# Patient Record
Sex: Female | Born: 1975 | Race: White | Hispanic: No | Smoking: Former smoker
Health system: Southern US, Community
[De-identification: ages and names within clinical notes are randomized; demographics above are authoritative.]

## PROBLEM LIST (undated history)

## (undated) DIAGNOSIS — Z8711 Personal history of peptic ulcer disease: Secondary | ICD-10-CM

## (undated) DIAGNOSIS — Z8489 Family history of other specified conditions: Secondary | ICD-10-CM

## (undated) DIAGNOSIS — K219 Gastro-esophageal reflux disease without esophagitis: Secondary | ICD-10-CM

## (undated) DIAGNOSIS — F329 Major depressive disorder, single episode, unspecified: Secondary | ICD-10-CM

## (undated) DIAGNOSIS — M1990 Unspecified osteoarthritis, unspecified site: Secondary | ICD-10-CM

## (undated) DIAGNOSIS — F419 Anxiety disorder, unspecified: Secondary | ICD-10-CM

## (undated) DIAGNOSIS — F32A Depression, unspecified: Secondary | ICD-10-CM

## (undated) HISTORY — PX: CHOLECYSTECTOMY: SHX55

## (undated) HISTORY — PX: TUBAL LIGATION: SHX77

---

## 2007-09-13 ENCOUNTER — Other Ambulatory Visit: Payer: Self-pay

## 2007-09-13 ENCOUNTER — Emergency Department: Payer: Self-pay | Admitting: Internal Medicine

## 2010-07-11 ENCOUNTER — Ambulatory Visit: Payer: Self-pay | Admitting: Surgery

## 2010-07-18 ENCOUNTER — Ambulatory Visit: Payer: Self-pay | Admitting: Surgery

## 2010-07-20 LAB — PATHOLOGY REPORT

## 2010-09-26 ENCOUNTER — Emergency Department: Payer: Self-pay | Admitting: Emergency Medicine

## 2010-11-18 ENCOUNTER — Ambulatory Visit: Payer: Self-pay | Admitting: Family Medicine

## 2011-09-25 ENCOUNTER — Emergency Department: Payer: Self-pay | Admitting: *Deleted

## 2012-11-05 ENCOUNTER — Emergency Department: Payer: Self-pay | Admitting: Emergency Medicine

## 2012-11-05 LAB — CK TOTAL AND CKMB (NOT AT ARMC): CK-MB: 0.7 ng/mL (ref 0.5–3.6)

## 2012-11-05 LAB — BASIC METABOLIC PANEL
Anion Gap: 8 (ref 7–16)
BUN: 8 mg/dL (ref 7–18)
Calcium, Total: 8.8 mg/dL (ref 8.5–10.1)
Co2: 25 mmol/L (ref 21–32)
Creatinine: 0.77 mg/dL (ref 0.60–1.30)
EGFR (Non-African Amer.): >60
Potassium: 3.6 mmol/L (ref 3.5–5.1)
Sodium: 138 mmol/L (ref 136–145)

## 2012-11-05 LAB — CBC
MCHC: 33.9 g/dL (ref 32.0–36.0)
MCV: 87 fL (ref 80–100)
RBC: 4.49 10*6/uL (ref 3.80–5.20)

## 2013-12-16 ENCOUNTER — Emergency Department: Payer: Self-pay | Admitting: Emergency Medicine

## 2016-05-09 ENCOUNTER — Encounter
Admission: RE | Admit: 2016-05-09 | Discharge: 2016-05-09 | Disposition: A | Discharge status: Home / Self Care | Payer: BLUE CROSS/BLUE SHIELD | Source: Ambulatory Visit | Attending: Obstetrics and Gynecology | Admitting: Obstetrics and Gynecology

## 2016-05-09 DIAGNOSIS — Z01812 Encounter for preprocedural laboratory examination: Secondary | ICD-10-CM | POA: Insufficient documentation

## 2016-05-09 HISTORY — DX: Gastro-esophageal reflux disease without esophagitis: K21.9

## 2016-05-09 HISTORY — DX: Anxiety disorder, unspecified: F41.9

## 2016-05-09 HISTORY — DX: Family history of other specified conditions: Z84.89

## 2016-05-09 HISTORY — DX: Unspecified osteoarthritis, unspecified site: M19.90

## 2016-05-09 HISTORY — DX: Depression, unspecified: F32.A

## 2016-05-09 HISTORY — DX: Personal history of peptic ulcer disease: Z87.11

## 2016-05-09 HISTORY — DX: Major depressive disorder, single episode, unspecified: F32.9

## 2016-05-09 LAB — COMPREHENSIVE METABOLIC PANEL
ALT: 13 U/L — ABNORMAL LOW (ref 14–54)
AST: 17 U/L (ref 15–41)
Albumin: 4.4 g/dL (ref 3.5–5.0)
Alkaline Phosphatase: 65 U/L (ref 38–126)
Anion gap: 5 (ref 5–15)
BUN: 12 mg/dL (ref 6–20)
CO2: 26 mmol/L (ref 22–32)
Calcium: 9.5 mg/dL (ref 8.9–10.3)
Chloride: 107 mmol/L (ref 101–111)
Creatinine, Ser: 0.65 mg/dL (ref 0.44–1.00)
GFR calc Af Amer: >60 mL/min (ref >60)
GFR calc non Af Amer: >60 mL/min (ref >60)
Glucose, Bld: 76 mg/dL (ref 65–99)
Potassium: 4.1 mmol/L (ref 3.5–5.1)
Sodium: 138 mmol/L (ref 135–145)
Total Bilirubin: 0.3 mg/dL (ref 0.3–1.2)
Total Protein: 7.9 g/dL (ref 6.5–8.1)

## 2016-05-09 LAB — CBC
HCT: 35.6 % (ref 35.0–47.0)
Hemoglobin: 11.8 g/dL — ABNORMAL LOW (ref 12.0–16.0)
MCH: 27.6 pg (ref 26.0–34.0)
MCHC: 33.2 g/dL (ref 32.0–36.0)
MCV: 83.1 fL (ref 80.0–100.0)
Platelets: 326 10*3/uL (ref 150–440)
RBC: 4.28 MIL/uL (ref 3.80–5.20)
RDW: 15.5 % — ABNORMAL HIGH (ref 11.5–14.5)
WBC: 6.9 10*3/uL (ref 3.6–11.0)

## 2016-05-09 LAB — TYPE AND SCREEN
ABO/RH(D): O POS
Antibody Screen: NEGATIVE

## 2016-05-09 NOTE — Patient Instructions (Signed)
  Your procedure is scheduled LI:DCVUDTHY August 3 , 2017. Report to Same Day Surgery. To find out your arrival time please call [phone] between 1PM - 3PM on Wednesday May 14, 2016.  Remember: Instructions that are not followed completely may result in serious medical risk, up to and including death, or upon the discretion of your surgeon and anesthesiologist your surgery may need to be rescheduled.    _x___ 1. Do not eat food or drink liquids after midnight. No gum chewing or hard candies.     ____ 2. No Alcohol for 24 hours before or after surgery.   ____ 3. Bring all medications with you on the day of surgery if instructed.    __x__ 4. Notify your doctor if there is any change in your medical condition     (cold, fever, infections).     Do not wear jewelry, make-up, hairpins, clips or nail polish.  Do not wear lotions, powders, or perfumes. You may wear deodorant.  Do not shave 48 hours prior to surgery. Men may shave face and neck.  Do not bring valuables to the hospital.    [facility] is not responsible for any belongings or valuables.               Contacts, dentures or bridgework may not be worn into surgery.  Leave your suitcase in the car. After surgery it may be brought to your room.  For patients admitted to the hospital, discharge time is determined by your treatment team.   Patients discharged the day of surgery will not be allowed to drive home.    Please read over the following fact sheets that you were given:   [facility] Preparing for Surgery  _x___ Take these medicines the morning of surgery with A SIP OF WATER:    1. omeprazole (PRILOSEC)     ____ Fleet Enema (as directed)   ____ Use CHG Soap as directed on instruction sheet  ____ Use inhalers on the day of surgery and bring to hospital day of surgery  ____ Stop metformin 2 days prior to surgery    ____ Take 1/2 of usual insulin dose the night before surgery and none on the morning of surgery.    ____ Stop Coumadin/Plavix/aspirin on does not apply.  _x___ Stop Anti-inflammatories such as: Meloxicam, Advil, Aleve, Ibuprofen, Motrin, Naproxen, Naprosyn, Goodies powders or aspirin products. OK to take Tylenol.   ____ Stop supplements until after surgery.    ____ Bring C-Pap to the hospital.

## 2016-05-15 ENCOUNTER — Ambulatory Visit: Payer: BLUE CROSS/BLUE SHIELD | Admitting: Anesthesiology

## 2016-05-15 ENCOUNTER — Ambulatory Visit
Admission: RE | Admit: 2016-05-15 | Discharge: 2016-05-15 | Disposition: A | Discharge status: Home / Self Care | Payer: BLUE CROSS/BLUE SHIELD | Source: Ambulatory Visit | Attending: Obstetrics and Gynecology | Admitting: Obstetrics and Gynecology

## 2016-05-15 ENCOUNTER — Encounter: Payer: Self-pay | Admitting: *Deleted

## 2016-05-15 ENCOUNTER — Encounter
Admission: RE | Disposition: A | Discharge status: Home / Self Care | Payer: Self-pay | Source: Ambulatory Visit | Attending: Obstetrics and Gynecology

## 2016-05-15 DIAGNOSIS — F418 Other specified anxiety disorders: Secondary | ICD-10-CM | POA: Diagnosis not present

## 2016-05-15 DIAGNOSIS — Z9049 Acquired absence of other specified parts of digestive tract: Secondary | ICD-10-CM | POA: Diagnosis not present

## 2016-05-15 DIAGNOSIS — E669 Obesity, unspecified: Secondary | ICD-10-CM | POA: Diagnosis not present

## 2016-05-15 DIAGNOSIS — K219 Gastro-esophageal reflux disease without esophagitis: Secondary | ICD-10-CM | POA: Insufficient documentation

## 2016-05-15 DIAGNOSIS — N921 Excessive and frequent menstruation with irregular cycle: Secondary | ICD-10-CM | POA: Diagnosis not present

## 2016-05-15 DIAGNOSIS — N920 Excessive and frequent menstruation with regular cycle: Secondary | ICD-10-CM | POA: Diagnosis present

## 2016-05-15 DIAGNOSIS — Z87891 Personal history of nicotine dependence: Secondary | ICD-10-CM | POA: Diagnosis not present

## 2016-05-15 DIAGNOSIS — Z808 Family history of malignant neoplasm of other organs or systems: Secondary | ICD-10-CM | POA: Diagnosis not present

## 2016-05-15 DIAGNOSIS — Z8249 Family history of ischemic heart disease and other diseases of the circulatory system: Secondary | ICD-10-CM | POA: Insufficient documentation

## 2016-05-15 DIAGNOSIS — Z8041 Family history of malignant neoplasm of ovary: Secondary | ICD-10-CM | POA: Diagnosis not present

## 2016-05-15 DIAGNOSIS — Z79899 Other long term (current) drug therapy: Secondary | ICD-10-CM | POA: Diagnosis not present

## 2016-05-15 DIAGNOSIS — Z6827 Body mass index (BMI) 27.0-27.9, adult: Secondary | ICD-10-CM | POA: Insufficient documentation

## 2016-05-15 HISTORY — PX: DILITATION & CURRETTAGE/HYSTROSCOPY WITH NOVASURE ABLATION: SHX5568

## 2016-05-15 LAB — POCT PREGNANCY, URINE: Preg Test, Ur: NEGATIVE

## 2016-05-15 SURGERY — DILATATION & CURETTAGE/HYSTEROSCOPY WITH NOVASURE ABLATION
Anesthesia: General

## 2016-05-15 MED ORDER — HYDROCODONE-ACETAMINOPHEN 5-325 MG PO TABS: 1.0000 | ORAL_TABLET | Freq: Four times a day (QID) | ORAL | 0 refills | Status: AC | PRN

## 2016-05-15 MED ORDER — DEXAMETHASONE SODIUM PHOSPHATE 10 MG/ML IJ SOLN
INTRAMUSCULAR | Status: DC | PRN
  Administered 2016-05-15: 5 mg via INTRAVENOUS

## 2016-05-15 MED ORDER — FENTANYL CITRATE (PF) 100 MCG/2ML IJ SOLN
INTRAMUSCULAR | Status: DC | PRN
  Administered 2016-05-15 (×2): 25 ug via INTRAVENOUS

## 2016-05-15 MED ORDER — LIDOCAINE HCL (CARDIAC) 20 MG/ML IV SOLN
INTRAVENOUS | Status: DC | PRN
  Administered 2016-05-15: 45 mg via INTRAVENOUS

## 2016-05-15 MED ORDER — MIDAZOLAM HCL 5 MG/5ML IJ SOLN
INTRAMUSCULAR | Status: DC | PRN
  Administered 2016-05-15: 2 mg via INTRAVENOUS

## 2016-05-15 MED ORDER — LACTATED RINGERS IV SOLN: INTRAVENOUS | Status: DC

## 2016-05-15 MED ORDER — MEPERIDINE HCL 25 MG/ML IJ SOLN: 6.2500 mg | INTRAMUSCULAR | Status: DC | PRN

## 2016-05-15 MED ORDER — FENTANYL CITRATE (PF) 100 MCG/2ML IJ SOLN
INTRAMUSCULAR | Status: AC
  Filled 2016-05-15: qty 2

## 2016-05-15 MED ORDER — FENTANYL CITRATE (PF) 100 MCG/2ML IJ SOLN
25.0000 ug | INTRAMUSCULAR | Status: DC | PRN
  Administered 2016-05-15: 50 ug via INTRAVENOUS
  Administered 2016-05-15: 25 ug via INTRAVENOUS
  Administered 2016-05-15: 50 ug via INTRAVENOUS
  Administered 2016-05-15: 25 ug via INTRAVENOUS

## 2016-05-15 MED ORDER — PROMETHAZINE HCL 25 MG/ML IJ SOLN: 6.2500 mg | INTRAMUSCULAR | Status: DC | PRN

## 2016-05-15 MED ORDER — LACTATED RINGERS IV SOLN
INTRAVENOUS | Status: DC
  Administered 2016-05-15: 12:00:00 via INTRAVENOUS

## 2016-05-15 MED ORDER — SILVER NITRATE-POT NITRATE 75-25 % EX MISC
CUTANEOUS | Status: AC
  Filled 2016-05-15: qty 4

## 2016-05-15 MED ORDER — ONDANSETRON HCL 4 MG/2ML IJ SOLN
INTRAMUSCULAR | Status: DC | PRN
  Administered 2016-05-15: 4 mg via INTRAVENOUS

## 2016-05-15 MED ORDER — PROPOFOL 10 MG/ML IV BOLUS
INTRAVENOUS | Status: DC | PRN
  Administered 2016-05-15: 150 mg via INTRAVENOUS

## 2016-05-15 MED ORDER — KETOROLAC TROMETHAMINE 30 MG/ML IJ SOLN
INTRAMUSCULAR | Status: DC | PRN
  Administered 2016-05-15: 30 mg via INTRAVENOUS

## 2016-05-15 MED ORDER — IBUPROFEN 600 MG PO TABS: 600.0000 mg | ORAL_TABLET | Freq: Four times a day (QID) | ORAL | 0 refills | Status: AC | PRN

## 2016-05-15 MED ORDER — OXYCODONE HCL 5 MG PO TABS: 5.0000 mg | ORAL_TABLET | Freq: Once | ORAL | Status: DC | PRN

## 2016-05-15 MED ORDER — OXYCODONE HCL 5 MG/5ML PO SOLN: 5.0000 mg | Freq: Once | ORAL | Status: DC | PRN

## 2016-05-15 SURGICAL SUPPLY — 19 items
ABLATOR ENDOMETRIAL MYOSURE (ABLATOR) ×2 IMPLANT
CANISTER SUC SOCK COL 7IN (MISCELLANEOUS) ×2 IMPLANT
CATH ROBINSON RED A/P 16FR (CATHETERS) ×2 IMPLANT
DEVICE MYOSURE LITE (MISCELLANEOUS) IMPLANT
ELECT REM PT RETURN 9FT ADLT (ELECTROSURGICAL) ×2
ELECTRODE REM PT RTRN 9FT ADLT (ELECTROSURGICAL) ×1 IMPLANT
GLOVE BIO SURGEON STRL SZ7 (GLOVE) ×4 IMPLANT
GLOVE BIOGEL PI IND STRL 7.5 (GLOVE) ×2 IMPLANT
GLOVE BIOGEL PI INDICATOR 7.5 (GLOVE) ×2
GOWN STRL REUS W/ TWL LRG LVL3 (GOWN DISPOSABLE) ×1 IMPLANT
GOWN STRL REUS W/TWL LRG LVL3 (GOWN DISPOSABLE) ×1
IV LACTATED RINGER IRRG 3000ML (IV SOLUTION) ×1
IV LR IRRIG 3000ML ARTHROMATIC (IV SOLUTION) ×1 IMPLANT
KIT RM TURNOVER CYSTO AR (KITS) ×2 IMPLANT
PACK DNC HYST (MISCELLANEOUS) ×2 IMPLANT
PAD OB MATERNITY 4.3X12.25 (PERSONAL CARE ITEMS) ×2 IMPLANT
PAD PREP 24X41 OB/GYN DISP (PERSONAL CARE ITEMS) ×2 IMPLANT
TUBING CONNECTING 10 (TUBING) ×2 IMPLANT
TUBING HYSTEROSCOPY DOLPHIN (MISCELLANEOUS) IMPLANT

## 2016-05-15 NOTE — Op Note (Signed)
Operative Note   11/27/2015  PRE-OP DIAGNOSIS: Menorrhagia with irregular cycle  POST-OP DIAGNOSIS: Menorrhagia with irregular cycle  SURGEON: Conard Novak, MD  PROCEDURE: DILATATION & CURETTAGE/HYSTEROSCOPY WITH NOVASURE ABLATION  ANESTHESIA: General   ESTIMATED BLOOD LOSS: 10 mL   IV Fluid: 700 mL crystalloid  SPECIMENS: endometrial curettings  FLUID DEFICIT: min   COMPLICATIONS: none   DISPOSITION: PACU - hemodynamically stable.   CONDITION: stable   FINDINGS: Exam under anesthesia revealed small, mobile uterus with no masses and bilateral adnexa without masses or fullness. Hysteroscopy revealed no evidence of polyp, otherwise grossly normal appearing uterine cavity with bilateral tubal ostia and normal appearing endocervical canal. Findings after ablation revealed globally ablated endometrium.    PROCEDURE IN DETAIL: After informed consent was obtained, the patient was taken to the operating room where anesthesia was obtained without difficulty. The patient was positioned in the dorsal lithotomy position in Tolar stirrups. The patient's bladder was catheterized with an in and out foley catheter. The patient was examined under anesthesia, with the above noted findings. The bivalved speculum was placed inside the patient's vagina, and the the anterior lip of the cervix was seen and grasped with the tenaculum. The uterine cavity was sounded to 8 cm, and then the cervix was progressively dilated to a 71mm using Hegar dilators. The 30 degree hysteroscope was introduced, with LR fluid used to distend the intrauterine cavity, with the above noted findings.   The hystersocope was removed and the uterine cavity was curetted until a gritty texture was noted, yielding endometrial curettings.    The NovaSure device was then placed without difficulty. Measurements were obtained. Patient was noted to have a uterine length of 4.5 cm, a cervical length of 3.5 cm, and a uterine width of 3.9  cm. The NovaSure device was first tested and after confirmation the procedures performed. Length of procedure was 81 seconds. The NovaSure device is then removed and repeat hysteroscopy reveals an appropriate lining of the uterus and no perforation or injury. Hysteroscope is removed with minimal discrepancy of fluid.   Tenaculum was removed with excellent hemostasis noted after application of silver nitrate to the tenaculum entry sites. She was then taken out of dorsal lithotomy. Hemostasis noted.  The patient tolerated the procedure well. Sponge, lap and needle counts were correct x2. The patient was taken to recovery room in excellent condition.  Thomasene Mohair, MD 05/15/2016 1:00 PM

## 2016-05-15 NOTE — Anesthesia Procedure Notes (Signed)
Procedure Name: LMA Insertion Date/Time: 05/15/2016 12:12 PM Performed by: Lily Kocher Pre-anesthesia Checklist: Patient identified, Patient being monitored, Timeout performed, Emergency Drugs available and Suction available Patient Re-evaluated:Patient Re-evaluated prior to inductionOxygen Delivery Method: Circle system utilized Preoxygenation: Pre-oxygenation with 100% oxygen Intubation Type: IV induction Ventilation: Mask ventilation without difficulty LMA: LMA inserted LMA Size: 4.0 Tube type: Oral Number of attempts: 1 Placement Confirmation: positive ETCO2 and breath sounds checked- equal and bilateral Tube secured with: Tape Dental Injury: Teeth and Oropharynx as per pre-operative assessment

## 2016-05-15 NOTE — Transfer of Care (Addendum)
Immediate Anesthesia Transfer of Care Note  Patient: Lisa Dickson  Procedure(s) Performed: Procedure(s): DILATATION & CURETTAGE/HYSTEROSCOPY WITH NOVASURE ABLATION (N/A)  Patient Location: PACU  Anesthesia Type:General  Level of Consciousness: awake and patient cooperative  Airway & Oxygen Therapy: Patient Spontanous Breathing and Patient connected to face mask oxygen  Post-op Assessment: Report given to RN  Post vital signs: Reviewed and stable  Last Vitals:  Vitals:   05/15/16 1052 05/15/16 1313  BP: 126/76 130/83  Pulse: 65 62  Resp: 16 20  Temp: 36.4 C (!) (P) 36.1 C    Last Pain:  Vitals:   05/15/16 1052  TempSrc: Tympanic         Complications: No apparent anesthesia complications

## 2016-05-15 NOTE — Progress Notes (Signed)
No complaints of pain on discharge and peri pad dry

## 2016-05-15 NOTE — Anesthesia Postprocedure Evaluation (Signed)
Anesthesia Post Note  Patient: Lisa Dickson  Procedure(s) Performed: Procedure(s) (LRB): DILATATION & CURETTAGE/HYSTEROSCOPY WITH NOVASURE ABLATION (N/A)  Patient location during evaluation: PACU Anesthesia Type: General Level of consciousness: awake and alert and oriented Pain management: pain level controlled Vital Signs Assessment: post-procedure vital signs reviewed and stable Respiratory status: spontaneous breathing, nonlabored ventilation and respiratory function stable Cardiovascular status: blood pressure returned to baseline and stable Postop Assessment: no signs of nausea or vomiting Anesthetic complications: no    Last Vitals:  Vitals:   05/15/16 1400 05/15/16 1405  BP:  115/76  Pulse: 64 (!) 58  Resp: 15 10  Temp:  36.1 C    Last Pain:  Vitals:   05/15/16 1405  TempSrc:   PainSc: 2                  Mariana Wiederholt

## 2016-05-15 NOTE — Anesthesia Preprocedure Evaluation (Signed)
Anesthesia Evaluation  Patient identified by MRN, date of birth, ID band Patient awake    Reviewed: Allergy & Precautions, NPO status , Patient's Chart, lab work & pertinent test results  History of Anesthesia Complications Negative for: history of anesthetic complications  Airway Mallampati: I  TM Distance: >3 FB Neck ROM: Full    Dental no notable dental hx.    Pulmonary neg COPD, former smoker,    breath sounds clear to auscultation- rhonchi (-) wheezing      Cardiovascular Exercise Tolerance: Good (-) hypertension(-) CAD and (-) Past MI  Rhythm:Regular Rate:Normal - Systolic murmurs and - Diastolic murmurs    Neuro/Psych Anxiety Depression negative neurological ROS     GI/Hepatic Neg liver ROS, GERD  ,  Endo/Other  negative endocrine ROSneg diabetes  Renal/GU negative Renal ROS     Musculoskeletal   Abdominal (+) - obese,   Peds  Hematology negative hematology ROS (+)   Anesthesia Other Findings Past Medical History: No date: Anxiety No date: Arthritis     Comment: generalized No date: Depression No date: Family history of adverse reaction to anesthes*     Comment: sister is allergic pain meds No date: GERD (gastroesophageal reflux disease) No date: History of bleeding ulcers   Reproductive/Obstetrics Menorrhagia                              Anesthesia Physical Anesthesia Plan  ASA: II  Anesthesia Plan: General   Post-op Pain Management:    Induction:   Airway Management Planned: LMA  Additional Equipment:   Intra-op Plan:   Post-operative Plan:   Informed Consent: I have reviewed the patients History and Physical, chart, labs and discussed the procedure including the risks, benefits and alternatives for the proposed anesthesia with the patient or authorized representative who has indicated his/her understanding and acceptance.   Dental advisory given  Plan  Discussed with: CRNA and Anesthesiologist  Anesthesia Plan Comments:         Anesthesia Quick Evaluation

## 2016-05-15 NOTE — H&P (Signed)
History and Physical Interval Note:  Lisa Dickson  has presented today for surgery, with the diagnosis of MENORRHAGIA WITH IRREGULAR PERIODS  The various methods of treatment have been discussed with the patient and family. After consideration of risks, benefits and other options for treatment, the patient has consented to  Procedure(s): DILATATION & CURETTAGE/HYSTEROSCOPY WITH NOVASURE ABLATION (N/A) as a surgical intervention .  The patient's history has been reviewed, patient examined, no change in status, stable for surgery.  I have reviewed the patient's chart and labs.  Questions were answered to the patient's satisfaction.    Conard Novak, MD 05/15/2016 11:42 AM

## 2016-05-15 NOTE — Discharge Instructions (Addendum)
AMBULATORY SURGERY  °DISCHARGE INSTRUCTIONS ° ° °1) The drugs that you were given will stay in your system until tomorrow so for the next 24 hours you should not: ° °A) Drive an automobile °B) Make any legal decisions °C) Drink any alcoholic beverage ° ° °2) You may resume regular meals tomorrow.  Today it is better to start with liquids and gradually work up to solid foods. ° °You may eat anything you prefer, but it is better to start with liquids, then soup and crackers, and gradually work up to solid foods. ° ° °3) Please notify your doctor immediately if you have any unusual bleeding, trouble breathing, redness and pain at the surgery site, drainage, fever, or pain not relieved by medication. ° ° ° °4) Additional Instructions: ° ° ° ° ° ° ° °Please contact your physician with any problems or Same Day Surgery at [phone], Monday through Friday 6 am to 4 pm, or [place] at [place] Main number at [phone].AMBULATORY SURGERY  °DISCHARGE INSTRUCTIONS ° ° °5) The drugs that you were given will stay in your system until tomorrow so for the next 24 hours you should not: ° °D) Drive an automobile °E) Make any legal decisions °F) Drink any alcoholic beverage ° ° °6) You may resume regular meals tomorrow.  Today it is better to start with liquids and gradually work up to solid foods. ° °You may eat anything you prefer, but it is better to start with liquids, then soup and crackers, and gradually work up to solid foods. ° ° °7) Please notify your doctor immediately if you have any unusual bleeding, trouble breathing, redness and pain at the surgery site, drainage, fever, or pain not relieved by medication. ° ° ° °8) Additional Instructions: ° ° ° ° ° ° ° °Please contact your physician with any problems or Same Day Surgery at [phone], Monday through Friday 6 am to 4 pm, or [place] at [place] Main number at [phone]. °

## 2016-05-16 LAB — SURGICAL PATHOLOGY

## 2018-02-16 DIAGNOSIS — Z1231 Encounter for screening mammogram for malignant neoplasm of breast: Secondary | ICD-10-CM | POA: Diagnosis not present

## 2018-04-14 DIAGNOSIS — M545 Low back pain: Secondary | ICD-10-CM | POA: Diagnosis not present

## 2018-04-16 DIAGNOSIS — M545 Low back pain: Secondary | ICD-10-CM | POA: Diagnosis not present

## 2018-04-20 DIAGNOSIS — M545 Low back pain: Secondary | ICD-10-CM | POA: Diagnosis not present

## 2018-12-29 DIAGNOSIS — M2241 Chondromalacia patellae, right knee: Secondary | ICD-10-CM | POA: Diagnosis not present

## 2019-02-04 DIAGNOSIS — M2241 Chondromalacia patellae, right knee: Secondary | ICD-10-CM | POA: Diagnosis not present

## 2019-05-17 DIAGNOSIS — M5136 Other intervertebral disc degeneration, lumbar region: Secondary | ICD-10-CM | POA: Diagnosis not present

## 2019-05-17 DIAGNOSIS — M5126 Other intervertebral disc displacement, lumbar region: Secondary | ICD-10-CM | POA: Diagnosis not present

## 2019-05-17 DIAGNOSIS — M47816 Spondylosis without myelopathy or radiculopathy, lumbar region: Secondary | ICD-10-CM | POA: Diagnosis not present

## 2019-05-17 DIAGNOSIS — M549 Dorsalgia, unspecified: Secondary | ICD-10-CM | POA: Diagnosis not present

## 2019-05-17 DIAGNOSIS — M5416 Radiculopathy, lumbar region: Secondary | ICD-10-CM | POA: Diagnosis not present

## 2019-05-17 DIAGNOSIS — M546 Pain in thoracic spine: Secondary | ICD-10-CM | POA: Diagnosis not present

## 2019-05-23 DIAGNOSIS — M4726 Other spondylosis with radiculopathy, lumbar region: Secondary | ICD-10-CM | POA: Diagnosis not present

## 2019-05-23 DIAGNOSIS — M5136 Other intervertebral disc degeneration, lumbar region: Secondary | ICD-10-CM | POA: Diagnosis not present

## 2019-05-23 DIAGNOSIS — M48061 Spinal stenosis, lumbar region without neurogenic claudication: Secondary | ICD-10-CM | POA: Diagnosis not present

## 2019-07-26 DIAGNOSIS — Z6826 Body mass index (BMI) 26.0-26.9, adult: Secondary | ICD-10-CM | POA: Diagnosis not present

## 2019-07-26 DIAGNOSIS — F330 Major depressive disorder, recurrent, mild: Secondary | ICD-10-CM | POA: Diagnosis not present

## 2019-07-26 DIAGNOSIS — Z0001 Encounter for general adult medical examination with abnormal findings: Secondary | ICD-10-CM | POA: Diagnosis not present

## 2019-07-26 DIAGNOSIS — Z0000 Encounter for general adult medical examination without abnormal findings: Secondary | ICD-10-CM | POA: Diagnosis not present

## 2019-07-26 DIAGNOSIS — E663 Overweight: Secondary | ICD-10-CM | POA: Diagnosis not present

## 2019-07-26 DIAGNOSIS — Z1389 Encounter for screening for other disorder: Secondary | ICD-10-CM | POA: Diagnosis not present

## 2019-09-27 DIAGNOSIS — M5136 Other intervertebral disc degeneration, lumbar region: Secondary | ICD-10-CM | POA: Diagnosis not present

## 2019-09-27 DIAGNOSIS — M5416 Radiculopathy, lumbar region: Secondary | ICD-10-CM | POA: Diagnosis not present

## 2019-09-27 DIAGNOSIS — M47816 Spondylosis without myelopathy or radiculopathy, lumbar region: Secondary | ICD-10-CM | POA: Diagnosis not present

## 2019-09-27 DIAGNOSIS — M5126 Other intervertebral disc displacement, lumbar region: Secondary | ICD-10-CM | POA: Diagnosis not present

## 2019-10-05 ENCOUNTER — Other Ambulatory Visit: Payer: Self-pay | Admitting: Neurosurgery

## 2019-10-05 DIAGNOSIS — M5416 Radiculopathy, lumbar region: Secondary | ICD-10-CM

## 2019-10-19 ENCOUNTER — Ambulatory Visit
Admission: RE | Admit: 2019-10-19 | Discharge: 2019-10-19 | Disposition: A | Discharge status: Home / Self Care | Payer: BC Managed Care – PPO | Source: Ambulatory Visit | Attending: Neurosurgery | Admitting: Neurosurgery

## 2019-10-19 DIAGNOSIS — M5416 Radiculopathy, lumbar region: Secondary | ICD-10-CM

## 2019-10-19 DIAGNOSIS — M5126 Other intervertebral disc displacement, lumbar region: Secondary | ICD-10-CM | POA: Diagnosis not present

## 2019-10-19 DIAGNOSIS — M48061 Spinal stenosis, lumbar region without neurogenic claudication: Secondary | ICD-10-CM | POA: Diagnosis not present

## 2019-10-19 IMAGING — MR MR LUMBAR SPINE W/O CM
4 of 5 series · 27 of 48 positions shown · non-contrast
Comparison: None.

CLINICAL DATA: Chronic back pain and bilateral leg weakness and
numbness

EXAM:
MRI LUMBAR SPINE WITHOUT CONTRAST
TECHNIQUE: Multiplanar, multisequence MR imaging of the lumbar spine was
performed. No intravenous contrast was administered.

[Series 2: T2 · sagittal · 4.0mm · 1.09mm/px · 6 of 16 slices shown (1 of 2)]
[im 1/16]
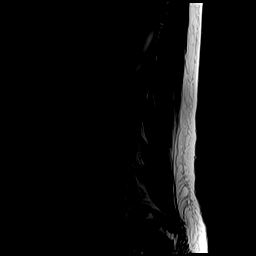
[im 4/16]
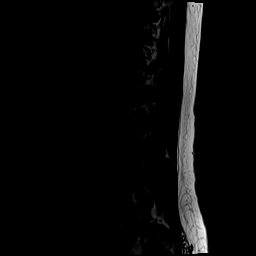
[im 7/16]
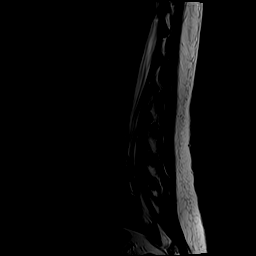
[im 10/16]
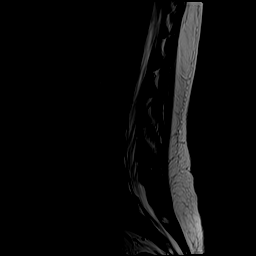
[im 13/16]
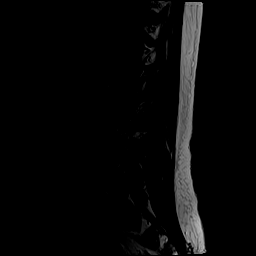
[im 16/16]
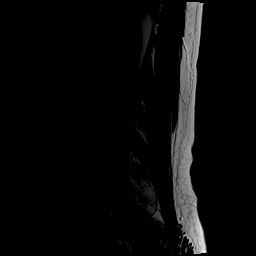

[Series 4: T1 · sagittal · 4.0mm · 1.09mm/px · 6 of 16 slices shown (1 of 2)]
[im 1/16]
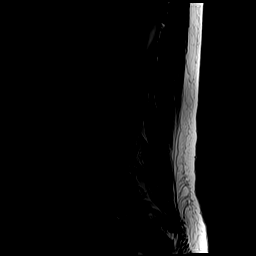
[im 4/16]
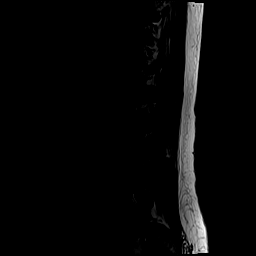
[im 7/16]
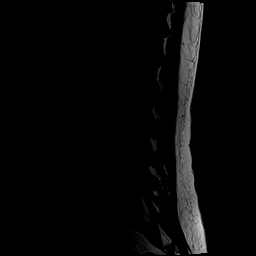
[im 10/16]
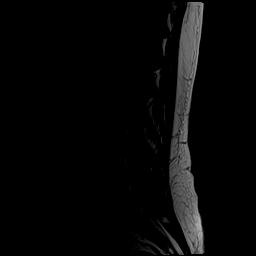
[im 13/16]
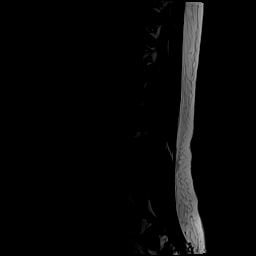
[im 16/16]
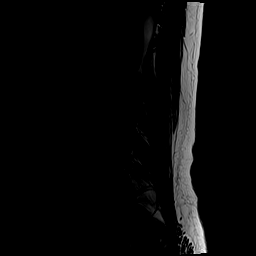

[Series 5: T2 · axial · 4.0mm · 0.39mm/px · z∈[-103,+119]mm · 9 of 41 slices shown (2 of 2)]
[im 1/41]
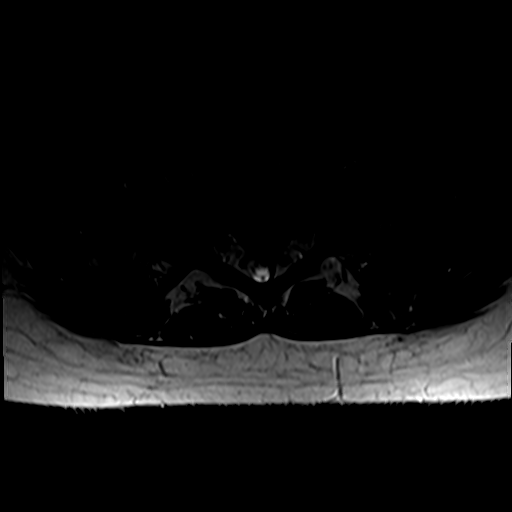
[im 6/41]
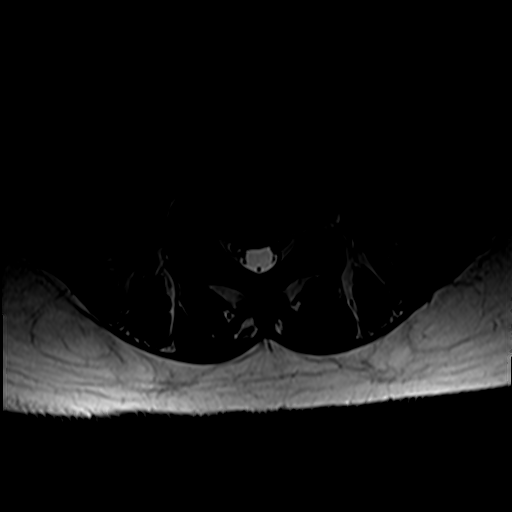
[im 12/41]
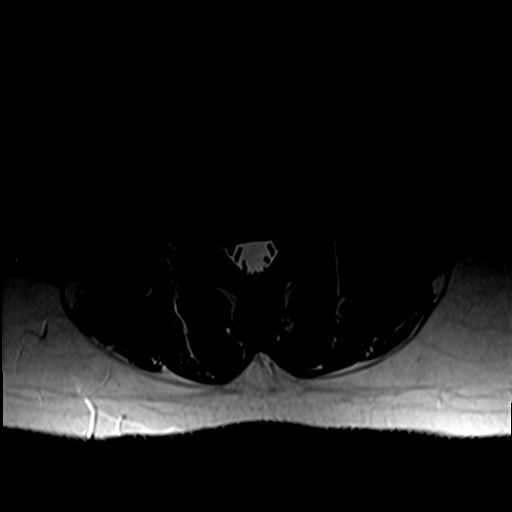
[im 18/41]
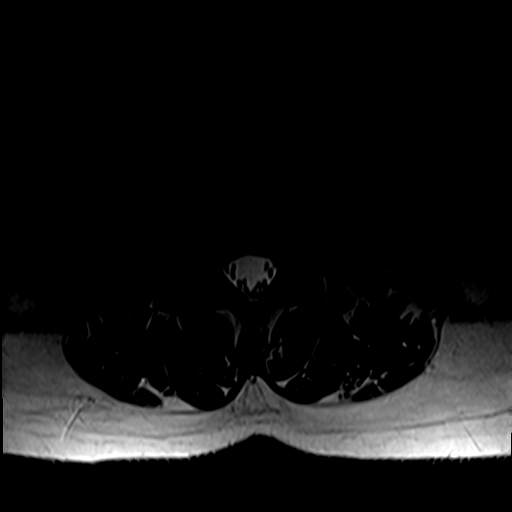
[im 21/41]
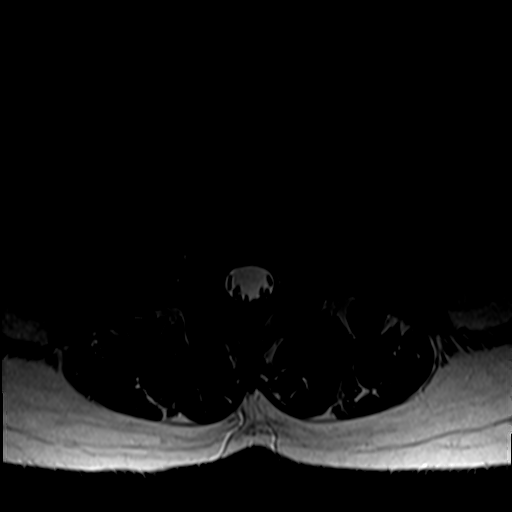
[im 23/41]
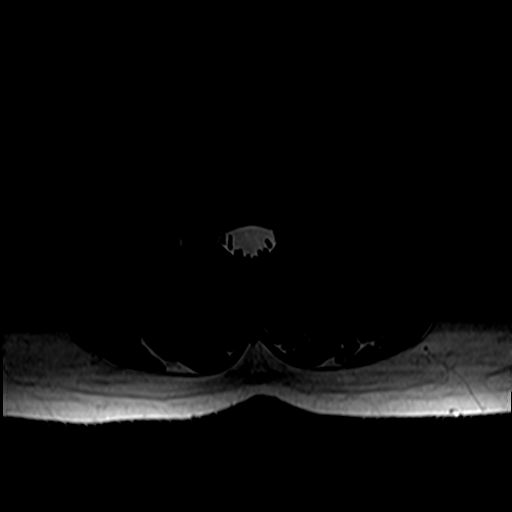
[im 29/41]
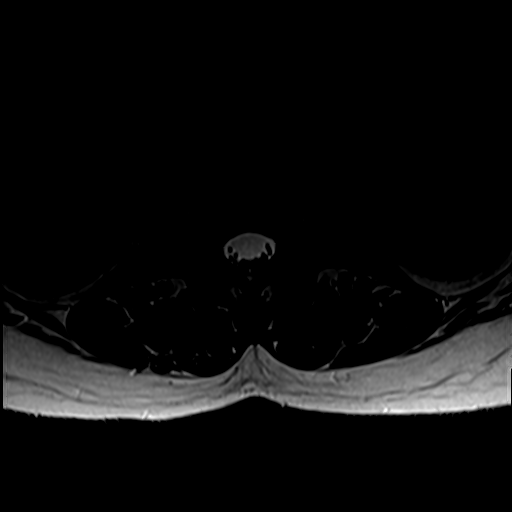
[im 35/41]
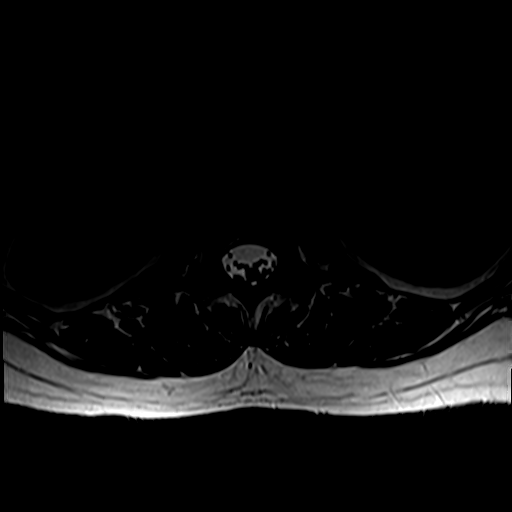
[im 41/41]
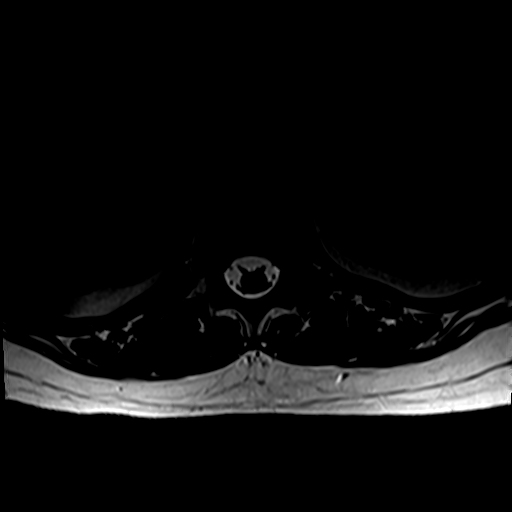

[Series 6: T1 · axial · 4.0mm · 0.39mm/px · z∈[-103,+91]mm · 6 of 41 slices shown (2 of 2)]
[im 1/41]
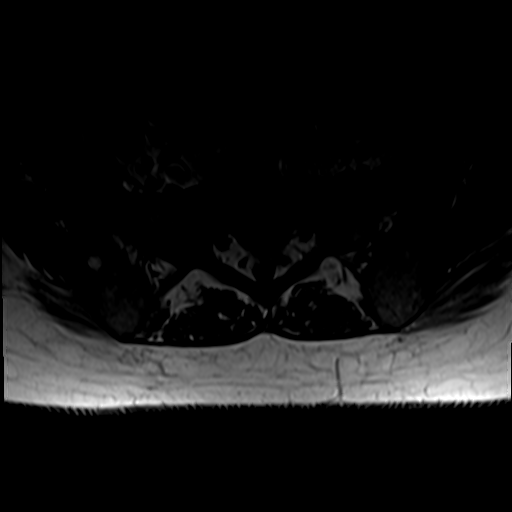
[im 6/41]
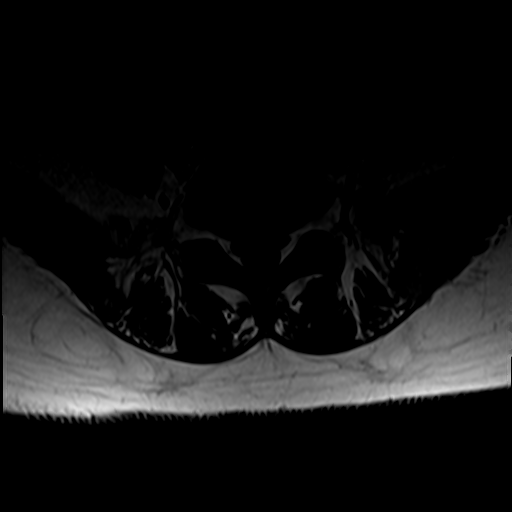
[im 12/41]
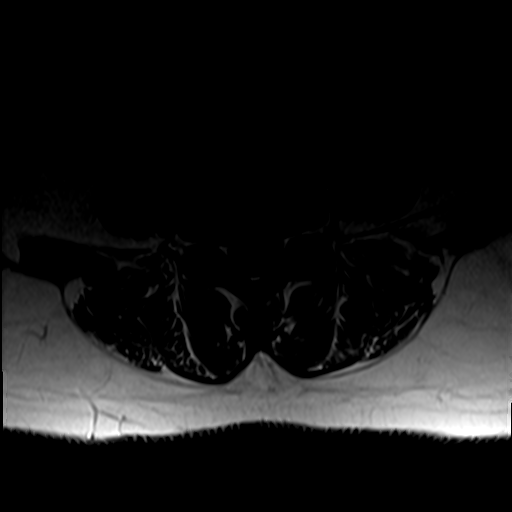
[im 18/41]
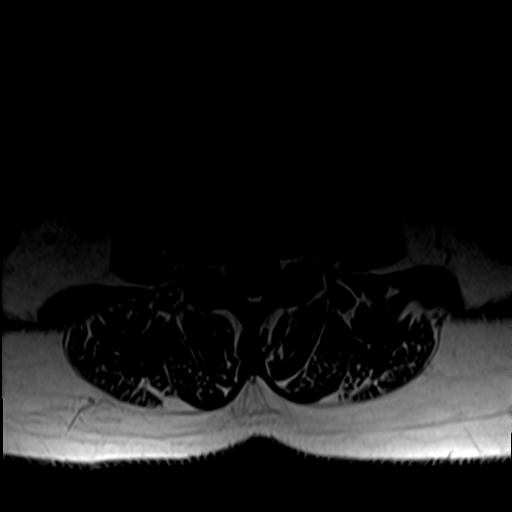
[im 21/41]
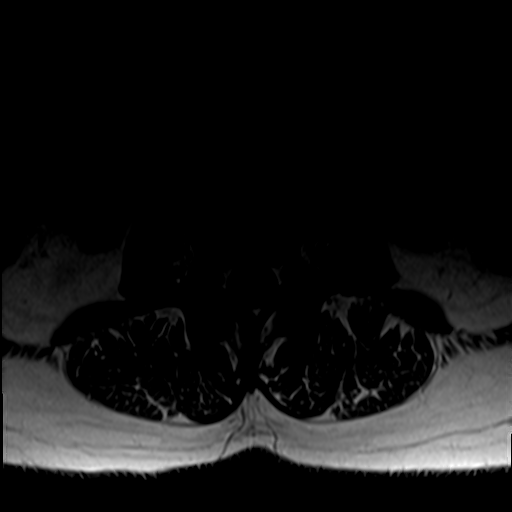
[im 35/41]
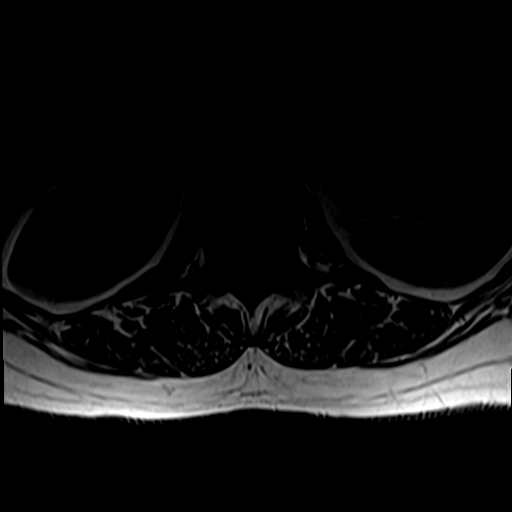

[27 of 48 positions shown; findings below may reference images not displayed]

FINDINGS: Segmentation:  Standard.

Alignment:  Alignment is maintained.

Vertebrae: Vertebral body heights are preserved. No marrow edema or
suspicious osseous lesion.

Conus medullaris and cauda equina: Conus extends to the L1-L2 level.
Conus and cauda equina appear normal.

Paraspinal and other soft tissues: Unremarkable.

Disc levels:

L1-L2:  No canal or foraminal stenosis.

L2-L3:  No canal or foraminal stenosis.

L3-L4: Disc bulge extending into the neural foramina with mild
endplate osteophytic ridging. No canal stenosis. Mild foraminal
stenosis.

L4-L5: Disc bulge extending into the neural foramina with mild
endplate osteophytic ridging. Mild facet arthropathy. No canal
stenosis. Mild foraminal stenosis.

L5-S1: Central disc protrusion with annular fissure. Minor canal
stenosis. Disc is in proximity to traversing S1 nerve roots without
definite compression. No foraminal stenosis.
IMPRESSION: Mild degenerative changes as detailed above without high-grade
stenosis.

## 2019-10-20 DIAGNOSIS — M5126 Other intervertebral disc displacement, lumbar region: Secondary | ICD-10-CM | POA: Diagnosis not present

## 2019-10-20 DIAGNOSIS — M5136 Other intervertebral disc degeneration, lumbar region: Secondary | ICD-10-CM | POA: Diagnosis not present

## 2019-10-20 DIAGNOSIS — M5416 Radiculopathy, lumbar region: Secondary | ICD-10-CM | POA: Diagnosis not present

## 2019-10-20 DIAGNOSIS — M47816 Spondylosis without myelopathy or radiculopathy, lumbar region: Secondary | ICD-10-CM | POA: Diagnosis not present

## 2019-10-27 DIAGNOSIS — M4726 Other spondylosis with radiculopathy, lumbar region: Secondary | ICD-10-CM | POA: Diagnosis not present

## 2019-10-27 DIAGNOSIS — M5136 Other intervertebral disc degeneration, lumbar region: Secondary | ICD-10-CM | POA: Diagnosis not present

## 2019-10-27 DIAGNOSIS — M48061 Spinal stenosis, lumbar region without neurogenic claudication: Secondary | ICD-10-CM | POA: Diagnosis not present

## 2020-01-05 DIAGNOSIS — E663 Overweight: Secondary | ICD-10-CM | POA: Diagnosis not present

## 2020-01-05 DIAGNOSIS — R109 Unspecified abdominal pain: Secondary | ICD-10-CM | POA: Diagnosis not present

## 2020-01-05 DIAGNOSIS — R110 Nausea: Secondary | ICD-10-CM | POA: Diagnosis not present

## 2020-01-05 DIAGNOSIS — Z6827 Body mass index (BMI) 27.0-27.9, adult: Secondary | ICD-10-CM | POA: Diagnosis not present

## 2020-01-05 DIAGNOSIS — R1010 Upper abdominal pain, unspecified: Secondary | ICD-10-CM | POA: Diagnosis not present

## 2020-01-05 DIAGNOSIS — Z1389 Encounter for screening for other disorder: Secondary | ICD-10-CM | POA: Diagnosis not present

## 2020-01-05 DIAGNOSIS — K219 Gastro-esophageal reflux disease without esophagitis: Secondary | ICD-10-CM | POA: Diagnosis not present

## 2020-02-21 DIAGNOSIS — Z713 Dietary counseling and surveillance: Secondary | ICD-10-CM | POA: Diagnosis not present

## 2020-02-21 DIAGNOSIS — K219 Gastro-esophageal reflux disease without esophagitis: Secondary | ICD-10-CM | POA: Diagnosis not present

## 2020-03-30 DIAGNOSIS — G8929 Other chronic pain: Secondary | ICD-10-CM | POA: Diagnosis not present

## 2020-03-30 DIAGNOSIS — M545 Low back pain: Secondary | ICD-10-CM | POA: Diagnosis not present

## 2020-03-30 DIAGNOSIS — M5441 Lumbago with sciatica, right side: Secondary | ICD-10-CM | POA: Diagnosis not present

## 2020-03-30 DIAGNOSIS — M5137 Other intervertebral disc degeneration, lumbosacral region: Secondary | ICD-10-CM | POA: Diagnosis not present

## 2020-04-23 ENCOUNTER — Encounter: Payer: Self-pay | Admitting: Advanced Practice Midwife

## 2020-04-23 ENCOUNTER — Other Ambulatory Visit: Payer: Self-pay

## 2020-04-23 ENCOUNTER — Ambulatory Visit (INDEPENDENT_AMBULATORY_CARE_PROVIDER_SITE_OTHER): Payer: BC Managed Care – PPO | Admitting: Advanced Practice Midwife

## 2020-04-23 ENCOUNTER — Other Ambulatory Visit (HOSPITAL_COMMUNITY)
Admission: RE | Admit: 2020-04-23 | Discharge: 2020-04-23 | Disposition: A | Discharge status: Home / Self Care | Payer: BC Managed Care – PPO | Source: Ambulatory Visit | Attending: Advanced Practice Midwife | Admitting: Advanced Practice Midwife

## 2020-04-23 VITALS — BP 120/80 | Ht 65.5 in | Wt 170.0 lb

## 2020-04-23 DIAGNOSIS — Z01419 Encounter for gynecological examination (general) (routine) without abnormal findings: Secondary | ICD-10-CM | POA: Insufficient documentation

## 2020-04-23 DIAGNOSIS — R102 Pelvic and perineal pain: Secondary | ICD-10-CM

## 2020-04-23 DIAGNOSIS — Z113 Encounter for screening for infections with a predominantly sexual mode of transmission: Secondary | ICD-10-CM | POA: Insufficient documentation

## 2020-04-23 DIAGNOSIS — Z124 Encounter for screening for malignant neoplasm of cervix: Secondary | ICD-10-CM | POA: Insufficient documentation

## 2020-04-23 NOTE — Patient Instructions (Signed)
Health Maintenance, Female Adopting a healthy lifestyle and getting preventive care are important in promoting health and wellness. Ask your health care provider about:  The right schedule for you to have regular tests and exams.  Things you can do on your own to prevent diseases and keep yourself healthy. What should I know about diet, weight, and exercise? Eat a healthy diet   Eat a diet that includes plenty of vegetables, fruits, low-fat dairy products, and lean protein.  Do not eat a lot of foods that are high in solid fats, added sugars, or sodium. Maintain a healthy weight Body mass index (BMI) is used to identify weight problems. It estimates body fat based on height and weight. Your health care provider can help determine your BMI and help you achieve or maintain a healthy weight. Get regular exercise Get regular exercise. This is one of the most important things you can do for your health. Most adults should:  Exercise for at least 150 minutes each week. The exercise should increase your heart rate and make you sweat (moderate-intensity exercise).  Do strengthening exercises at least twice a week. This is in addition to the moderate-intensity exercise.  Spend less time sitting. Even light physical activity can be beneficial. Watch cholesterol and blood lipids Have your blood tested for lipids and cholesterol at 42 years of age, then have this test every 5 years. Have your cholesterol levels checked more often if:  Your lipid or cholesterol levels are high.  You are older than 42 years of age.  You are at high risk for heart disease. What should I know about cancer screening? Depending on your health history and family history, you may need to have cancer screening at various ages. This may include screening for:  Breast cancer.  Cervical cancer.  Colorectal cancer.  Skin cancer.  Lung cancer. What should I know about heart disease, diabetes, and high blood  pressure? Blood pressure and heart disease  High blood pressure causes heart disease and increases the risk of stroke. This is more likely to develop in people who have high blood pressure readings, are of African descent, or are overweight.  Have your blood pressure checked: ? Every 3-5 years if you are 18-39 years of age. ? Every year if you are 40 years old or older. Diabetes Have regular diabetes screenings. This checks your fasting blood sugar level. Have the screening done:  Once every three years after age 40 if you are at a normal weight and have a low risk for diabetes.  More often and at a younger age if you are overweight or have a high risk for diabetes. What should I know about preventing infection? Hepatitis B If you have a higher risk for hepatitis B, you should be screened for this virus. Talk with your health care provider to find out if you are at risk for hepatitis B infection. Hepatitis C Testing is recommended for:  Everyone born from 1945 through 1965.  Anyone with known risk factors for hepatitis C. Sexually transmitted infections (STIs)  Get screened for STIs, including gonorrhea and chlamydia, if: ? You are sexually active and are younger than 42 years of age. ? You are older than 42 years of age and your health care provider tells you that you are at risk for this type of infection. ? Your sexual activity has changed since you were last screened, and you are at increased risk for chlamydia or gonorrhea. Ask your health care provider if   you are at risk.  Ask your health care provider about whether you are at high risk for HIV. Your health care provider may recommend a prescription medicine to help prevent HIV infection. If you choose to take medicine to prevent HIV, you should first get tested for HIV. You should then be tested every 3 months for as long as you are taking the medicine. Pregnancy  If you are about to stop having your period (premenopausal) and  you may become pregnant, seek counseling before you get pregnant.  Take 400 to 800 micrograms (mcg) of folic acid every day if you become pregnant.  Ask for birth control (contraception) if you want to prevent pregnancy. Osteoporosis and menopause Osteoporosis is a disease in which the bones lose minerals and strength with aging. This can result in bone fractures. If you are 65 years old or older, or if you are at risk for osteoporosis and fractures, ask your health care provider if you should:  Be screened for bone loss.  Take a calcium or vitamin D supplement to lower your risk of fractures.  Be given hormone replacement therapy (HRT) to treat symptoms of menopause. Follow these instructions at home: Lifestyle  Do not use any products that contain nicotine or tobacco, such as cigarettes, e-cigarettes, and chewing tobacco. If you need help quitting, ask your health care provider.  Do not use street drugs.  Do not share needles.  Ask your health care provider for help if you need support or information about quitting drugs. Alcohol use  Do not drink alcohol if: ? Your health care provider tells you not to drink. ? You are pregnant, may be pregnant, or are planning to become pregnant.  If you drink alcohol: ? Limit how much you use to 0-1 drink a day. ? Limit intake if you are breastfeeding.  Be aware of how much alcohol is in your drink. In the [place] one drink equals one 12 oz bottle of beer (355 mL), one 5 oz glass of wine (148 mL), or one 1 oz glass of hard liquor (44 mL). General instructions  Schedule regular health, dental, and eye exams.  Stay current with your vaccines.  Tell your health care provider if: ? You often feel depressed. ? You have ever been abused or do not feel safe at home. Summary  Adopting a healthy lifestyle and getting preventive care are important in promoting health and wellness.  Follow your health care provider's instructions about healthy  diet, exercising, and getting tested or screened for diseases.  Follow your health care provider's instructions on monitoring your cholesterol and blood pressure. This information is not intended to replace advice given to you by your health care provider. Make sure you discuss any questions you have with your health care provider. Document Revised: 09/22/2018 Document Reviewed: 09/22/2018 [employer] Patient Education  2020 [employer]  

## 2020-04-24 ENCOUNTER — Encounter: Payer: Self-pay | Admitting: Advanced Practice Midwife

## 2020-04-24 LAB — HEPATITIS PANEL, ACUTE
Hep A IgM: NEGATIVE
Hep B C IgM: NEGATIVE
Hep C Virus Ab: <0.1 s/co ratio (ref 0.0–0.9)
Hepatitis B Surface Ag: NEGATIVE

## 2020-04-24 LAB — HIV ANTIBODY (ROUTINE TESTING W REFLEX): HIV Screen 4th Generation wRfx: NONREACTIVE

## 2020-04-24 LAB — RPR QUALITATIVE: RPR Ser Ql: NONREACTIVE

## 2020-04-24 NOTE — Progress Notes (Signed)
Gynecology Annual Exam  PCP: [facility]  Chief Complaint:  Chief Complaint  Patient presents with  . Gynecologic Exam    History of Present Illness: Patient is a 44 y.o. G3P3003 presents for annual exam. The patient has complaint today of a sharp pain in her lower right abdomen that she has noticed for a couple of months. The pain occurs every couple of days a couple times on those days and lasts for a few minutes at a time.     LMP: No LMP recorded. Patient has had an ablation. She may be interested in a repeat ablation due to the spotting. Average Interval: She spots occasionally "here and there"  Intermenstrual Bleeding: not applicable Postcoital Bleeding: no Dysmenorrhea: not applicable   The patient is sexually active. She currently uses ablation for contraception. She denies dyspareunia.  The patient does not perform self breast exams.  There is no notable family history of breast or ovarian cancer in her family.  The patient wears seatbelts: yes.   The patient has regular exercise: she gardens and walks on the beach. She eats "everything she sees". She drinks some water and mainly drinks diet dr pepper. She has night sweats which sometimes interrupt her sleep.    The patient denies current symptoms of depression.  She reports being on the correct dose of Lexapro.  Review of Systems: Review of Systems  Constitutional: Negative for chills and fever.  HENT: Negative for congestion, ear discharge, ear pain, hearing loss, sinus pain and sore throat.   Eyes: Negative for blurred vision and double vision.  Respiratory: Negative for cough, shortness of breath and wheezing.   Cardiovascular: Negative for chest pain, palpitations and leg swelling.  Gastrointestinal: Negative for abdominal pain, blood in stool, constipation, diarrhea, heartburn, melena, nausea and vomiting.  Genitourinary: Negative for dysuria, flank pain, frequency, hematuria and urgency.   Musculoskeletal: Negative for back pain, joint pain and myalgias.  Skin: Negative for itching and rash.  Neurological: Negative for dizziness, tingling, tremors, sensory change, speech change, focal weakness, seizures, loss of consciousness, weakness and headaches.  Endo/Heme/Allergies: Negative for environmental allergies. Does not bruise/bleed easily.  Psychiatric/Behavioral: Negative for depression, hallucinations, memory loss, substance abuse and suicidal ideas. The patient is not nervous/anxious and does not have insomnia.     Past Medical History:  Patient Active Problem List   Diagnosis Date Noted  . 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 Menorrhagia with irregular cycle 05/15/2016    Past Surgical History:  Past Surgical History:  Procedure Laterality Date  . CHOLECYSTECTOMY    . DILITATION & CURRETTAGE/HYSTROSCOPY WITH NOVASURE ABLATION N/A 05/15/2016   Procedure: DILATATION & CURETTAGE/HYSTEROSCOPY WITH NOVASURE ABLATION;  Surgeon: Conard Novak, MD;  Location: [facility] ORS;  Service: Gynecology;  Laterality: N/A;  . TUBAL LIGATION      Gynecologic History:  No LMP recorded. Patient has had an ablation. Contraception: ablation Last Pap: 2015 Results were:  no abnormalities  Last mammogram: none   Obstetric History: G3P3003  Family History:  Family History  Problem Relation Age of Onset  . Cancer Mother   . Breast cancer Mother     Social History:  Social History   Socioeconomic History  . Marital status: Divorced    Spouse name: Not on file  . Number of children: Not on file  . Years of education: Not on file  . Highest education level: Not on file  Occupational History  . Not on file  Tobacco  Use  . Smoking status: Former Smoker    Quit date: 03/09/2016    Years since quitting: 4.1  . Smokeless tobacco: Former Clinical biochemist  . Vaping Use: Never used  Substance and Sexual Activity  . Alcohol use: No  . Drug use: No  . Sexual activity: Yes     Birth control/protection: Surgical  Other Topics Concern  . Not on file  Social History Narrative  . Not on file   Social Determinants of Health   Financial Resource Strain:   . Difficulty of Paying Living Expenses:   Food Insecurity:   . Worried About Programme researcher, broadcasting/film/video in the Last Year:   . Barista in the Last Year:   Transportation Needs:   . Freight forwarder (Medical):   Marland Kitchen Lack of Transportation (Non-Medical):   Physical Activity:   . Days of Exercise per Week:   . Minutes of Exercise per Session:   Stress:   . Feeling of Stress :   Social Connections:   . Frequency of Communication with Friends and Family:   . Frequency of Social Gatherings with Friends and Family:   . Attends Religious Services:   . Active Member of Clubs or Organizations:   . Attends Banker Meetings:   Marland Kitchen Marital Status:   Intimate Partner Violence:   . Fear of Current or Ex-Partner:   . Emotionally Abused:   Marland Kitchen Physically Abused:   . Sexually Abused:     Allergies:  No Known Allergies  Medications: Prior to Admission medications   Medication Sig Start Date End Date Taking? Authorizing Provider  FLUoxetine (PROZAC) 10 MG capsule Take 10 mg by mouth daily. 03/05/20  Yes [provider]  gabapentin (NEURONTIN) 300 MG capsule Take by mouth. 03/30/20  Yes [provider]  ibuprofen (ADVIL,MOTRIN) 600 MG tablet Take 1 tablet (600 mg total) by mouth every 6 (six) hours as needed for mild pain or cramping. 05/15/16  Yes Conard Novak, MD  omeprazole (PRILOSEC) 20 MG capsule Take 20 mg by mouth 2 (two) times daily before a meal.   Yes [provider]  pantoprazole (PROTONIX) 40 MG tablet pantoprazole 40 mg tablet,delayed release   Yes [provider]  tiZANidine (ZANAFLEX) 2 MG tablet Take 2 mg by mouth at bedtime as needed. 03/30/20  Yes [provider]  escitalopram (LEXAPRO) 5 MG tablet Take 5 mg by mouth daily. Patient not  taking: Reported on 04/23/2020    [provider]  HYDROcodone-acetaminophen (NORCO) 5-325 MG tablet Take 1 tablet by mouth every 6 (six) hours as needed for moderate pain. Patient not taking: Reported on 04/23/2020 05/15/16   Conard Novak, MD  MELOXICAM PO Take 1 tablet by mouth daily. Patient not taking: Reported on 04/23/2020    [provider]    Physical Exam Vitals: Blood pressure 120/80, height 5' 5.5" (1.664 m), weight 170 lb (77.1 kg).  General: NAD HEENT: normocephalic, anicteric Thyroid: no enlargement, no palpable nodules Pulmonary: No increased work of breathing, CTAB Cardiovascular: RRR, distal pulses 2+ Breast: Breast symmetrical, no tenderness, no palpable nodules or masses, no skin or nipple retraction present, no nipple discharge.  No axillary or supraclavicular lymphadenopathy. Abdomen: NABS, soft, non-tender, non-distended.  Umbilicus without lesions.  No hepatomegaly, splenomegaly or masses palpable. No evidence of hernia  Genitourinary:  External: Normal external female genitalia.  Normal urethral meatus, normal Bartholin's and Skene's glands.    Vagina: Normal vaginal mucosa,  no evidence of prolapse.    Cervix: Grossly normal in appearance, no bleeding  Uterus: Non-enlarged, mobile, normal contour.  No CMT  Adnexa: ovaries non-enlarged, no adnexal masses, tenderness to palpation- especially on left side  Rectal: deferred  Lymphatic: no evidence of inguinal lymphadenopathy Extremities: no edema, erythema, or tenderness Neurologic: Grossly intact Psychiatric: mood appropriate, affect full    Assessment: 44 y.o. G3P3003 routine annual exam  Plan: Problem List Items Addressed This Visit    None    Visit Diagnoses    Well woman exam with routine gynecological exam    -  Primary   Relevant Orders   HIV Antibody (routine testing w rflx) (Completed)   RPR Qual (Completed)   Cytology - PAP   Hepatitis panel, acute (Completed)   Cervical  cancer screening       Relevant Orders   Cytology - PAP   Screen for sexually transmitted diseases       Relevant Orders   HIV Antibody (routine testing w rflx) (Completed)   RPR Qual (Completed)   Cytology - PAP   Hepatitis panel, acute (Completed)   Pelvic pain in female       Relevant Orders   US PELVIS TRANSVAGINAL NON-OB (TV ONLY)      1) Mammogram - recommend yearly screening mammogram.  Mammogram declines   2) STI screening  was offered and accepted  3) ASCCP guidelines and rationale discussed.  Patient opts for every 5 years screening interval  4) Contraception - the patient is currently using  ablation.  She is happy with her current form of contraception and plans to continue  5) Colonoscopy -- Screening recommended starting at age 36 for average risk individuals, age 84 for individuals deemed at increased risk (including African Americans) and recommended to continue until age 101.  For patient age 39-85 individualized approach is recommended.  Gold standard screening is via colonoscopy, Cologuard screening is an acceptable alternative for patient unwilling or unable to undergo colonoscopy.  "Colorectal cancer screening for average?risk adults: 2018 guideline update from the American Cancer Society"[place]: A Cancer Journal for Clinicians: Mar 11, 2017   6) Routine healthcare maintenance including cholesterol, diabetes screening discussed Declines  7) Return in about 1 week (around 04/30/2020) for gyn u/s and follow up visit.   Tresea Mall, CNM [facility] 04/24/2020, 1:33 PM

## 2020-04-27 LAB — CYTOLOGY - PAP
Chlamydia: NEGATIVE
Comment: NEGATIVE
Comment: NEGATIVE
Comment: NEGATIVE
Comment: NORMAL
Diagnosis: NEGATIVE
High risk HPV: NEGATIVE
Neisseria Gonorrhea: NEGATIVE
Trichomonas: NEGATIVE

## 2020-05-02 DIAGNOSIS — M5417 Radiculopathy, lumbosacral region: Secondary | ICD-10-CM | POA: Diagnosis not present

## 2020-05-02 DIAGNOSIS — M5137 Other intervertebral disc degeneration, lumbosacral region: Secondary | ICD-10-CM | POA: Diagnosis not present

## 2020-05-02 DIAGNOSIS — M545 Low back pain: Secondary | ICD-10-CM | POA: Diagnosis not present

## 2020-05-02 DIAGNOSIS — M5441 Lumbago with sciatica, right side: Secondary | ICD-10-CM | POA: Diagnosis not present

## 2020-05-03 DIAGNOSIS — M5441 Lumbago with sciatica, right side: Secondary | ICD-10-CM | POA: Diagnosis not present

## 2020-05-03 DIAGNOSIS — M48061 Spinal stenosis, lumbar region without neurogenic claudication: Secondary | ICD-10-CM | POA: Diagnosis not present

## 2020-05-03 DIAGNOSIS — M5116 Intervertebral disc disorders with radiculopathy, lumbar region: Secondary | ICD-10-CM | POA: Diagnosis not present

## 2020-05-03 DIAGNOSIS — G8929 Other chronic pain: Secondary | ICD-10-CM | POA: Diagnosis not present

## 2020-05-08 ENCOUNTER — Ambulatory Visit: Payer: BC Managed Care – PPO | Admitting: Advanced Practice Midwife

## 2020-05-08 ENCOUNTER — Ambulatory Visit: Payer: Self-pay

## 2020-05-11 DIAGNOSIS — M5441 Lumbago with sciatica, right side: Secondary | ICD-10-CM | POA: Diagnosis not present

## 2020-05-11 DIAGNOSIS — M5116 Intervertebral disc disorders with radiculopathy, lumbar region: Secondary | ICD-10-CM | POA: Diagnosis not present

## 2020-05-11 DIAGNOSIS — M48061 Spinal stenosis, lumbar region without neurogenic claudication: Secondary | ICD-10-CM | POA: Diagnosis not present

## 2020-05-11 DIAGNOSIS — G8929 Other chronic pain: Secondary | ICD-10-CM | POA: Diagnosis not present

## 2020-05-31 DIAGNOSIS — M5116 Intervertebral disc disorders with radiculopathy, lumbar region: Secondary | ICD-10-CM | POA: Diagnosis not present

## 2020-05-31 DIAGNOSIS — M47816 Spondylosis without myelopathy or radiculopathy, lumbar region: Secondary | ICD-10-CM | POA: Diagnosis not present

## 2020-05-31 DIAGNOSIS — M48061 Spinal stenosis, lumbar region without neurogenic claudication: Secondary | ICD-10-CM | POA: Diagnosis not present

## 2020-05-31 DIAGNOSIS — M5441 Lumbago with sciatica, right side: Secondary | ICD-10-CM | POA: Diagnosis not present

## 2020-06-04 DIAGNOSIS — M47816 Spondylosis without myelopathy or radiculopathy, lumbar region: Secondary | ICD-10-CM | POA: Diagnosis not present

## 2020-06-28 DIAGNOSIS — M5441 Lumbago with sciatica, right side: Secondary | ICD-10-CM | POA: Diagnosis not present

## 2020-06-28 DIAGNOSIS — M47816 Spondylosis without myelopathy or radiculopathy, lumbar region: Secondary | ICD-10-CM | POA: Diagnosis not present

## 2020-06-28 DIAGNOSIS — M48061 Spinal stenosis, lumbar region without neurogenic claudication: Secondary | ICD-10-CM | POA: Diagnosis not present

## 2020-06-28 DIAGNOSIS — G8929 Other chronic pain: Secondary | ICD-10-CM | POA: Diagnosis not present

## 2020-09-17 DIAGNOSIS — M47816 Spondylosis without myelopathy or radiculopathy, lumbar region: Secondary | ICD-10-CM | POA: Diagnosis not present

## 2020-12-25 ENCOUNTER — Other Ambulatory Visit: Payer: Self-pay | Admitting: Physical Medicine & Rehabilitation

## 2020-12-25 ENCOUNTER — Other Ambulatory Visit: Payer: Self-pay

## 2020-12-25 ENCOUNTER — Ambulatory Visit
Admission: RE | Admit: 2020-12-25 | Discharge: 2020-12-25 | Disposition: A | Discharge status: Home / Self Care | Payer: BC Managed Care – PPO | Source: Ambulatory Visit | Attending: Physical Medicine & Rehabilitation | Admitting: Physical Medicine & Rehabilitation

## 2020-12-25 DIAGNOSIS — M48061 Spinal stenosis, lumbar region without neurogenic claudication: Secondary | ICD-10-CM | POA: Insufficient documentation

## 2020-12-25 IMAGING — MR MR LUMBAR SPINE WO/W CM
6 of 7 series · 31 of 48 positions shown · IV contrast (gadavist)
Comparison: [DATE]

CLINICAL DATA: Chronic low back pain with extension to the right
knee.

EXAM:
MRI LUMBAR SPINE WITHOUT AND WITH CONTRAST
TECHNIQUE: Multiplanar and multiecho pulse sequences of the lumbar spine were
obtained without and with intravenous contrast.
CONTRAST:  7mL GADAVIST GADOBUTROL 1 MMOL/ML IV SOLN

[Series 5: T2 · sagittal · 4.0mm · 0.81mm/px · 4 of 17 slices shown (1 of 2)]
[im 1/17]
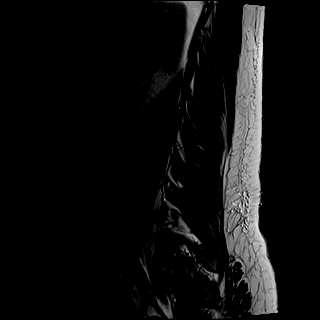
[im 6/17]
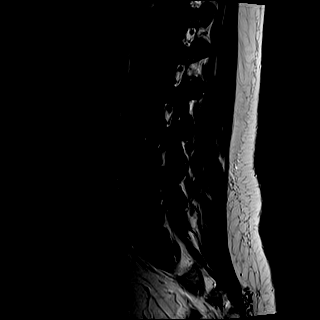
[im 11/17]
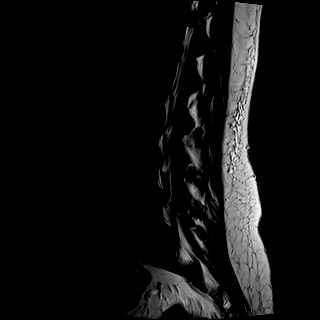
[im 17/17]
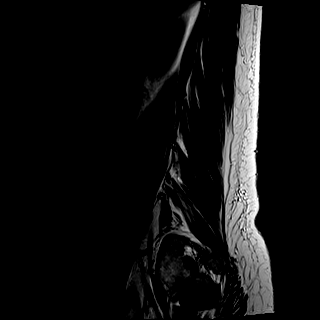

[Series 6: T1 · sagittal · 4.0mm · 0.81mm/px · 4 of 17 slices shown (1 of 2)]
[im 1/17]
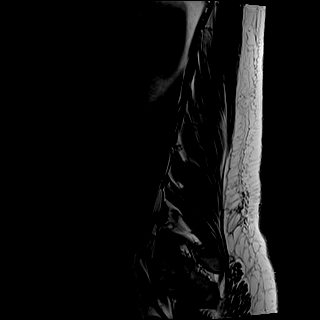
[im 6/17]
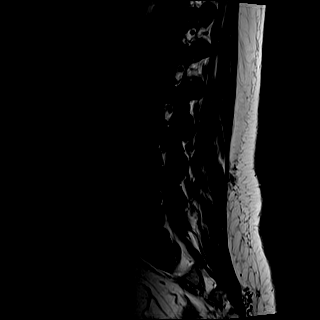
[im 11/17]
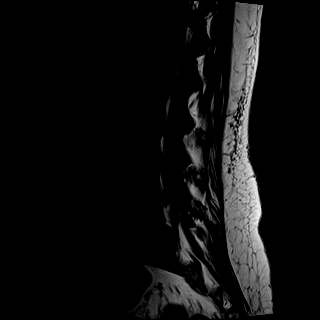
[im 17/17]
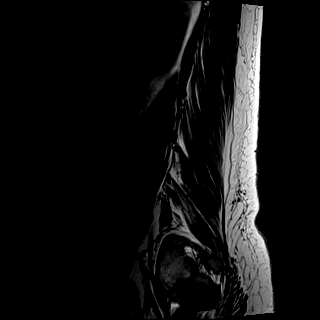

[Series 7: STIR · sagittal · 4.0mm · 0.41mm/px · 2 of 17 slices shown]
[im 1/17]
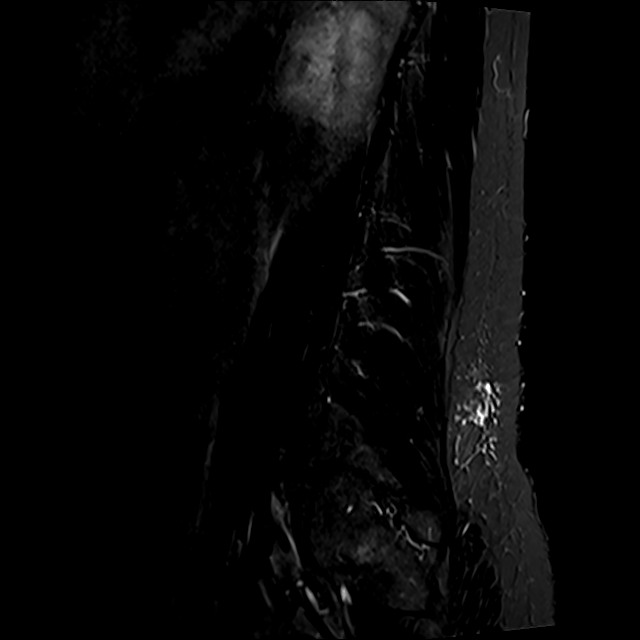
[im 5/17]
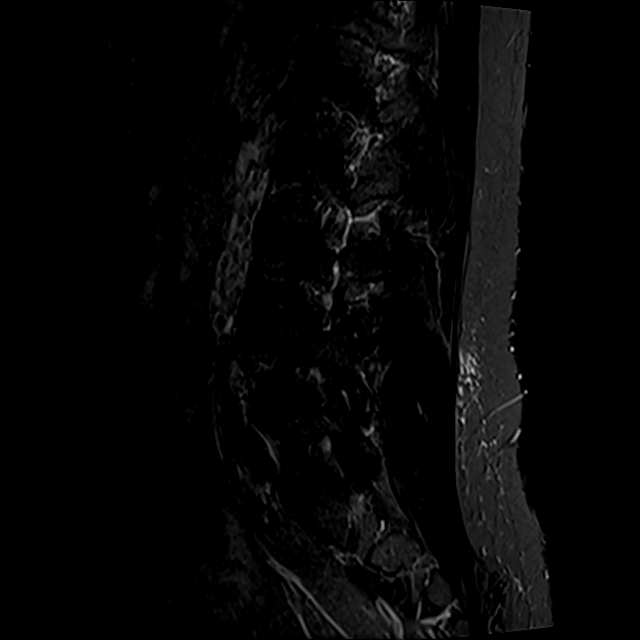

[Series 8: T2 · axial · 4.0mm · 0.78mm/px · z∈[-15,+193]mm · 8 of 36 slices shown (2 of 2)]
[im 1/36]
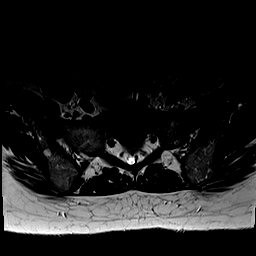
[im 4/36]
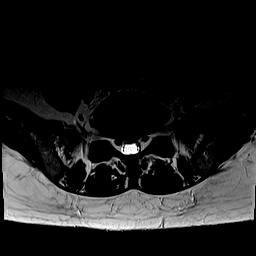
[im 12/36]
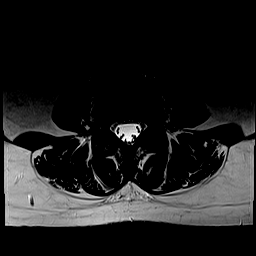
[im 16/36]
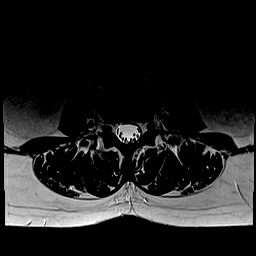
[im 20/36]
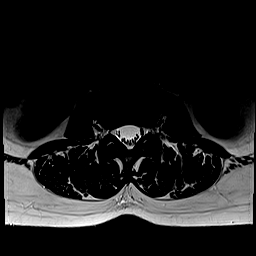
[im 24/36]
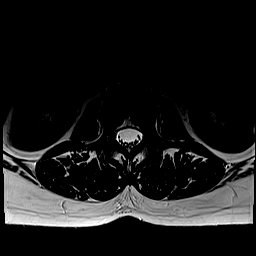
[im 32/36]
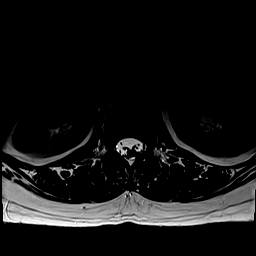
[im 36/36]
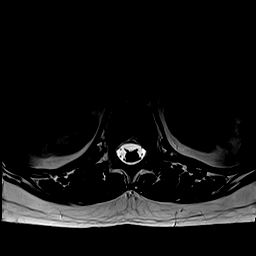

[Series 9: T1 · axial · 4.0mm · 0.39mm/px · z∈[-15,+193]mm · 8 of 36 slices shown (2 of 2)]
[im 1/36]
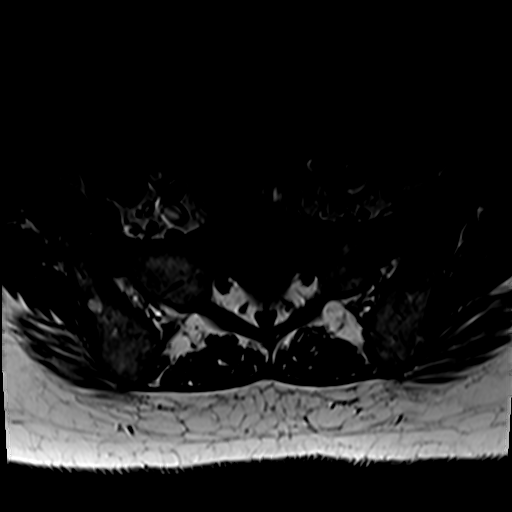
[im 4/36]
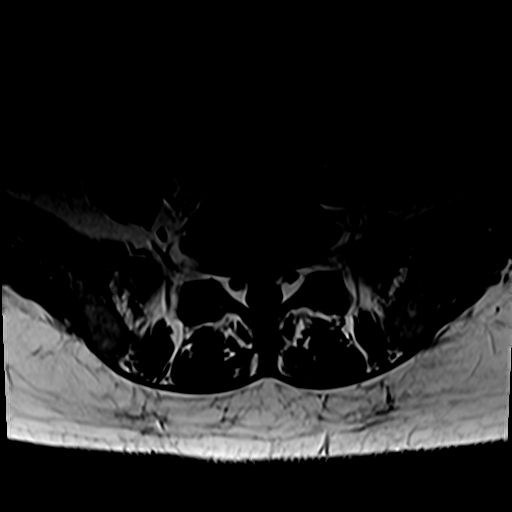
[im 12/36]
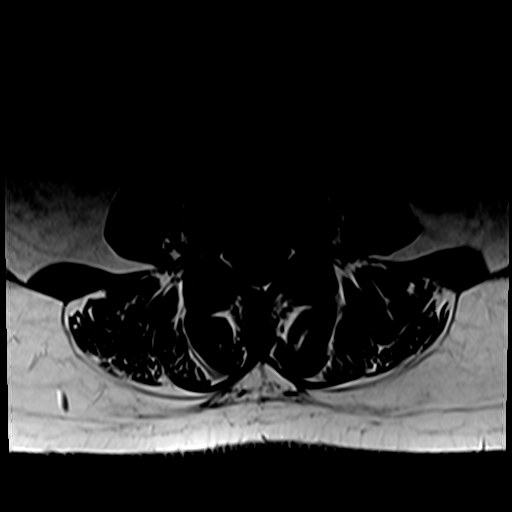
[im 16/36]
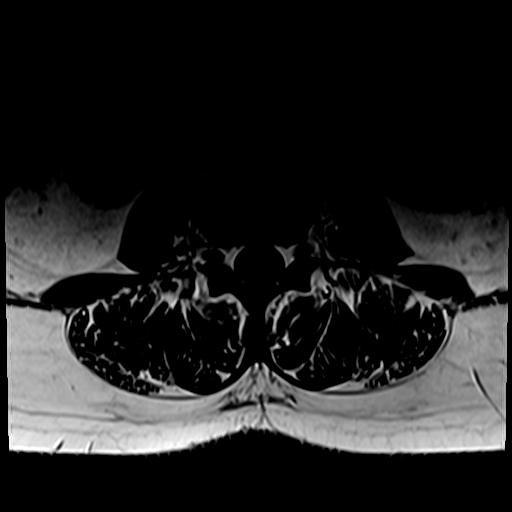
[im 20/36]
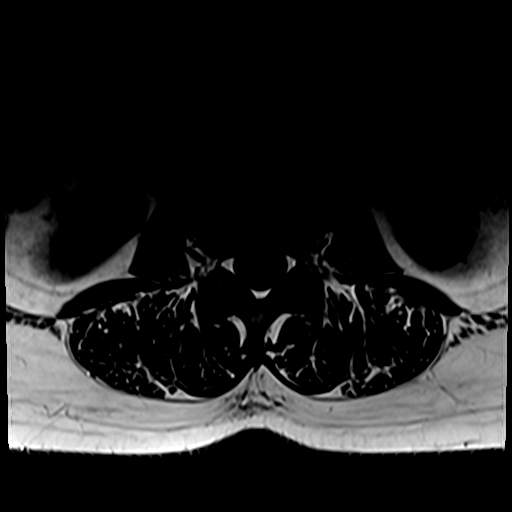
[im 24/36]
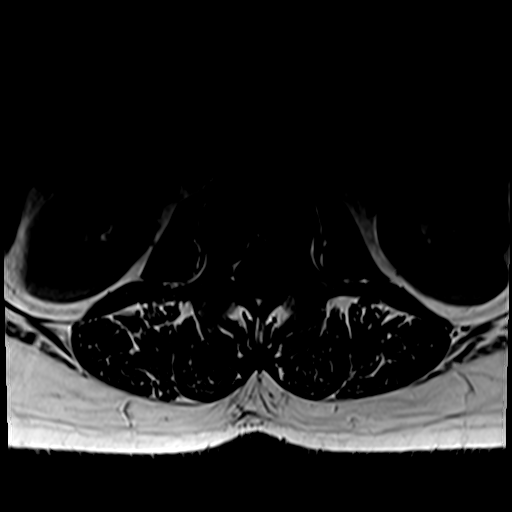
[im 32/36]
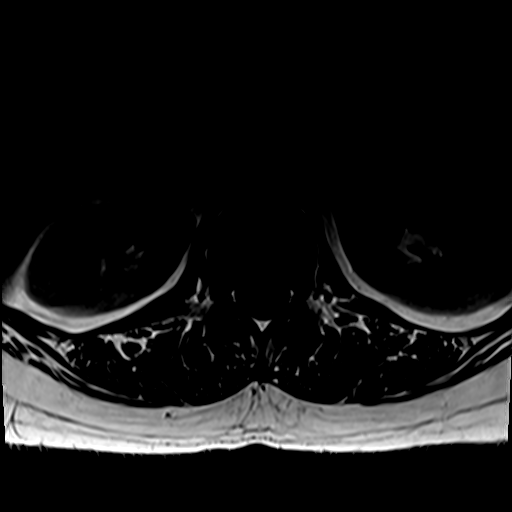
[im 36/36]
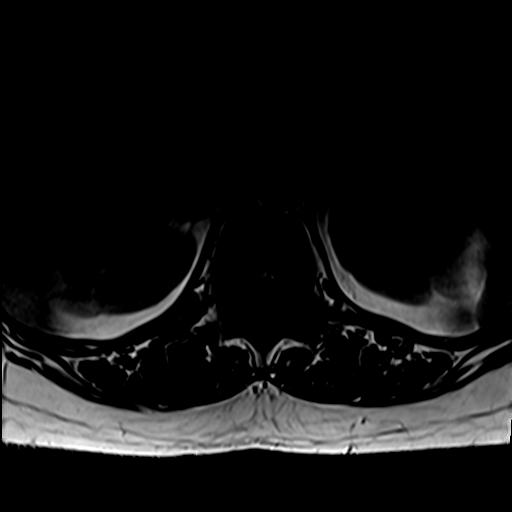

[Series 10: T1 fat-sat post-contrast · sagittal · 4.0mm · 0.81mm/px · 5 of 17 slices shown]
[im 1/17]
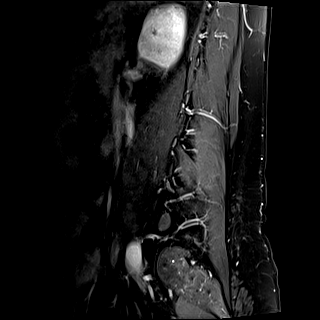
[im 5/17]
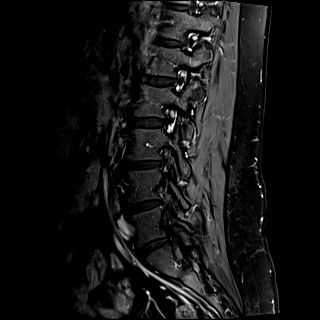
[im 9/17]
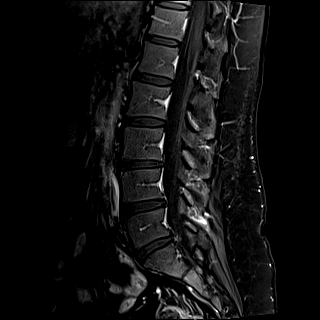
[im 13/17]
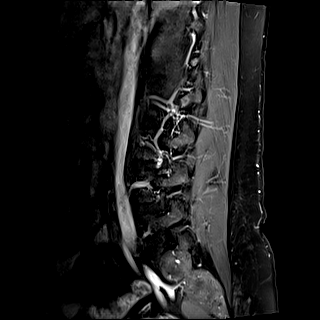
[im 17/17]
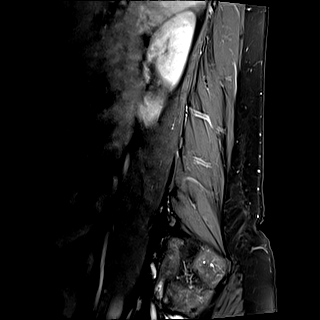

[31 of 48 positions shown; findings below may reference images not displayed]

FINDINGS: Segmentation:  5 lumbar type vertebrae

Alignment:  Normal

Vertebrae:  No fracture, evidence of discitis, or bone lesion.

Conus medullaris and cauda equina: Conus extends to the L1 level.
Conus and cauda equina appear normal.

Paraspinal and other soft tissues: Negative

Disc levels:

L3-L4: Disc narrowing and bulging with right foraminal annular
fissure and shallow protrusion. No neural compression

L4-L5: Disc narrowing and bulging with right paracentral protrusion
continuing into the foramen. Borderline facet spurring. Right
foraminal narrowing which is similar to prior and mild based on
axial images.

L5-S1:Disc narrowing and bulging with posterior annular fissure and
shallow protrusion. Negative facets.
IMPRESSION: Disc degeneration at L3-4 and below with chronic small protrusions.
No high-grade stenosis or definite neural compression. No new
finding when compared to [O1].

## 2020-12-25 MED ORDER — GADOBUTROL 1 MMOL/ML IV SOLN
7.0000 mL | Freq: Once | INTRAVENOUS | Status: AC | PRN
  Administered 2020-12-25: 7 mL via INTRAVENOUS
